# Patient Record
Sex: Male | Born: 1975 | Race: White | Hispanic: No | Marital: Married | Smoking: Current every day smoker
Health system: Southern US, Community
[De-identification: ages and names within clinical notes are randomized; demographics above are authoritative.]

## PROBLEM LIST (undated history)

## (undated) DIAGNOSIS — K219 Gastro-esophageal reflux disease without esophagitis: Secondary | ICD-10-CM

## (undated) HISTORY — PX: ACHILLES TENDON REPAIR: SUR1153

---

## 2006-05-17 ENCOUNTER — Emergency Department: Payer: Self-pay | Admitting: Emergency Medicine

## 2011-11-05 ENCOUNTER — Emergency Department: Payer: Self-pay | Admitting: Unknown Physician Specialty

## 2011-11-05 LAB — COMPREHENSIVE METABOLIC PANEL
Alkaline Phosphatase: 87 U/L (ref 50–136)
BUN: 9 mg/dL (ref 7–18)
Bilirubin,Total: 1.3 mg/dL — ABNORMAL HIGH (ref 0.2–1.0)
Chloride: 109 mmol/L — ABNORMAL HIGH (ref 98–107)
Co2: 26 mmol/L (ref 21–32)
Creatinine: 1.17 mg/dL (ref 0.60–1.30)
EGFR (Non-African Amer.): >60
Osmolality: 278 (ref 275–301)
SGOT(AST): 30 U/L (ref 15–37)
SGPT (ALT): 51 U/L

## 2011-11-05 LAB — CK TOTAL AND CKMB (NOT AT ARMC): CK-MB: 0.8 ng/mL (ref 0.5–3.6)

## 2011-11-05 LAB — CBC
HCT: 49.3 % (ref 40.0–52.0)
HGB: 17.4 g/dL (ref 13.0–18.0)
MCH: 31.9 pg (ref 26.0–34.0)
MCHC: 35.3 g/dL (ref 32.0–36.0)
MCV: 91 fL (ref 80–100)
RBC: 5.45 10*6/uL (ref 4.40–5.90)

## 2011-11-05 LAB — TROPONIN I: Troponin-I: <0.02 ng/mL

## 2011-11-05 LAB — TSH: Thyroid Stimulating Horm: 1.01 u[IU]/mL

## 2012-07-29 ENCOUNTER — Emergency Department: Payer: Self-pay | Admitting: Emergency Medicine

## 2013-04-23 ENCOUNTER — Ambulatory Visit: Payer: Self-pay | Admitting: Family Medicine

## 2013-08-04 ENCOUNTER — Ambulatory Visit: Payer: Self-pay

## 2013-08-05 ENCOUNTER — Ambulatory Visit (INDEPENDENT_AMBULATORY_CARE_PROVIDER_SITE_OTHER): Payer: BC Managed Care – PPO | Admitting: Podiatry

## 2013-08-05 ENCOUNTER — Ambulatory Visit (INDEPENDENT_AMBULATORY_CARE_PROVIDER_SITE_OTHER): Payer: BC Managed Care – PPO

## 2013-08-05 ENCOUNTER — Encounter: Payer: Self-pay | Admitting: Podiatry

## 2013-08-05 VITALS — Resp 16 | Ht 69.0 in | Wt 254.0 lb

## 2013-08-05 DIAGNOSIS — M8448XA Pathological fracture, other site, initial encounter for fracture: Secondary | ICD-10-CM

## 2013-08-05 DIAGNOSIS — M7660 Achilles tendinitis, unspecified leg: Secondary | ICD-10-CM

## 2013-08-05 DIAGNOSIS — M79609 Pain in unspecified limb: Secondary | ICD-10-CM

## 2013-08-05 DIAGNOSIS — M79673 Pain in unspecified foot: Secondary | ICD-10-CM

## 2013-08-05 MED ORDER — TRIAMCINOLONE ACETONIDE 10 MG/ML IJ SUSP: 10.0000 mg | Freq: Once | INTRAMUSCULAR | Status: AC

## 2013-08-05 NOTE — Progress Notes (Signed)
Subjective:     Patient ID: Jorge Bryan, male   DOB: Nov 11, 1975, 38 y.o.   MRN: [id number]  Foot Pain   patient points to the back of the left heel states it's been hurting me really badly for the last 5 days and I did get a new shoe the day that this started. I went to [facility] and told to stay off my foot   Review of Systems  All other systems reviewed and are negative.      Objective:   Physical Exam  Nursing note and vitals reviewed. Constitutional: He is oriented to person, place, and time.  Cardiovascular: Intact distal pulses.   Musculoskeletal: Normal range of motion.  Neurological: He is oriented to person, place, and time.  Skin: Skin is warm.   neurovascular status intact with range of motion of the subtalar and midtarsal joint adequate and mild equinus condition noted of both feet. I noted on the posterior left lateral heel there is intense discomfort at the Achilles tendon insertion with inflammation but minimal fluid buildup noted. Patient has good Fill time and does have high arch foot type    Assessment:     Achilles tendinitis left with inflammation lateral side versus stress fracture which is much less likely    Plan:     H&P and x-rays reviewed. Careful injection lateral Achilles 3 mg dexamethasone Kenalog 5 mg Xylocaine after discussing with patient chances for rupture. Applied air fracture walker to immobilize and gait instructions on physical therapy to start in 1 week. Reappoint 2 weeks

## 2013-08-05 NOTE — Progress Notes (Signed)
   Subjective:    Patient ID: Jorge Bryan, male    DOB: 30-Mar-1976, 38 y.o.   MRN: [id number]  HPI Comments: N pain L left heel and achilles D 5 days O slowly C worse A walking,  T went to urgent care yesterday, had x -ray, mobic, tylenol, staying off of foot,    Foot Pain Associated symptoms include a rash.      Review of Systems  Musculoskeletal:       Difficulty walking  Skin: Positive for rash.  All other systems reviewed and are negative.      Objective:   Physical Exam        Assessment & Plan:

## 2013-08-05 NOTE — Patient Instructions (Signed)

## 2013-08-19 ENCOUNTER — Ambulatory Visit: Payer: BC Managed Care – PPO | Admitting: Podiatry

## 2013-08-29 ENCOUNTER — Ambulatory Visit: Payer: BC Managed Care – PPO | Admitting: Podiatry

## 2014-07-17 IMAGING — CR DG THORACIC SPINE 2-3V
1 series · 4 of 4 positions shown · non-contrast
Comparison: None.

CLINICAL DATA: Pain post trauma

EXAM:
THORACIC SPINE - 3 VIEW

[Series 1: lat · 0.17mm/px · 4 of 4 slices shown]
[im 1/4]
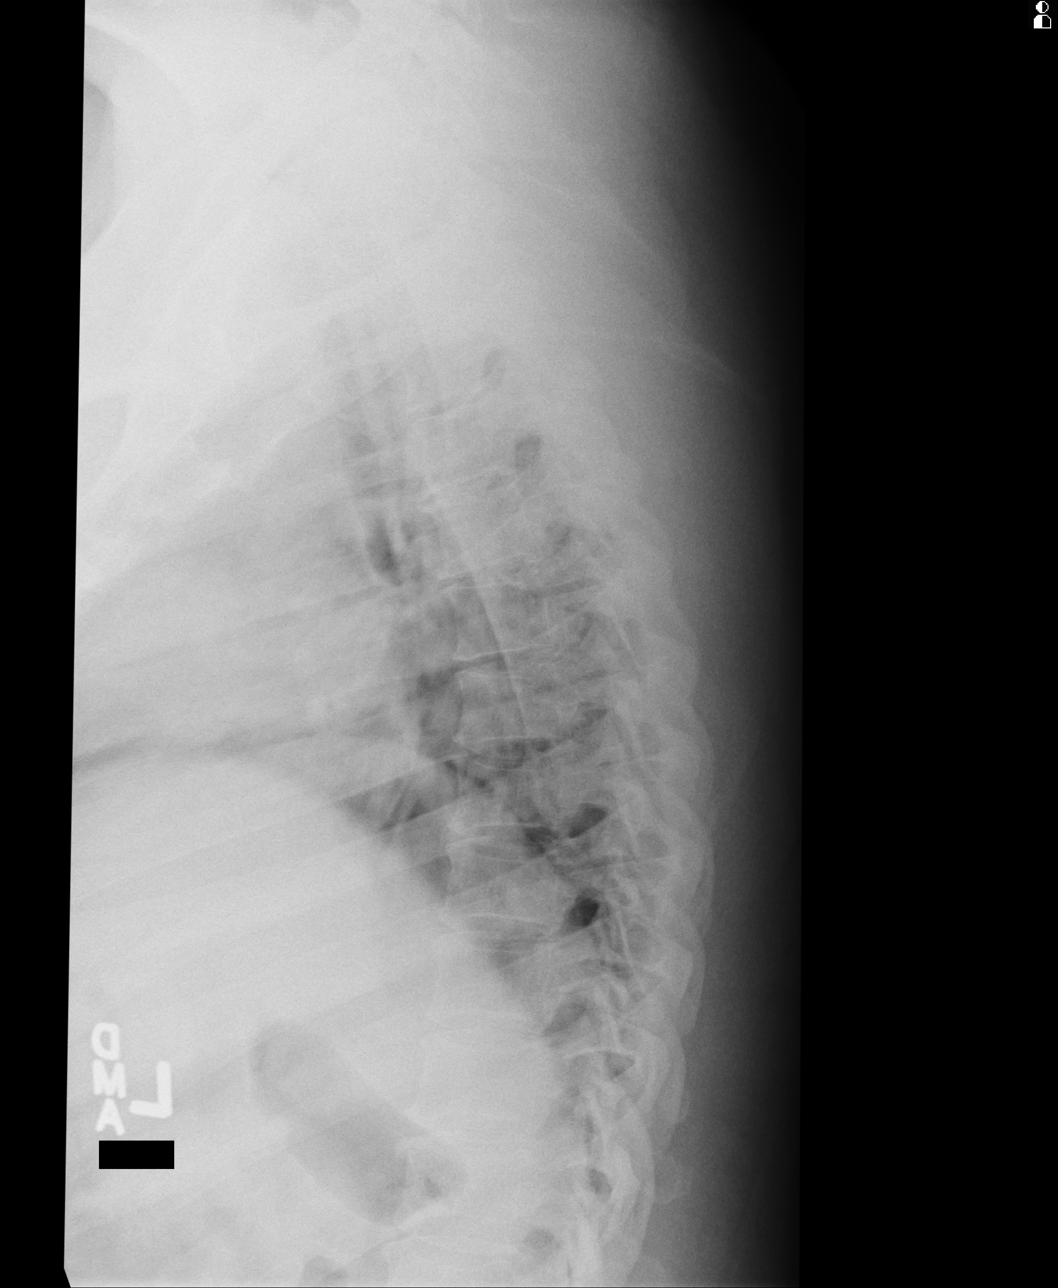
[im 2/4]
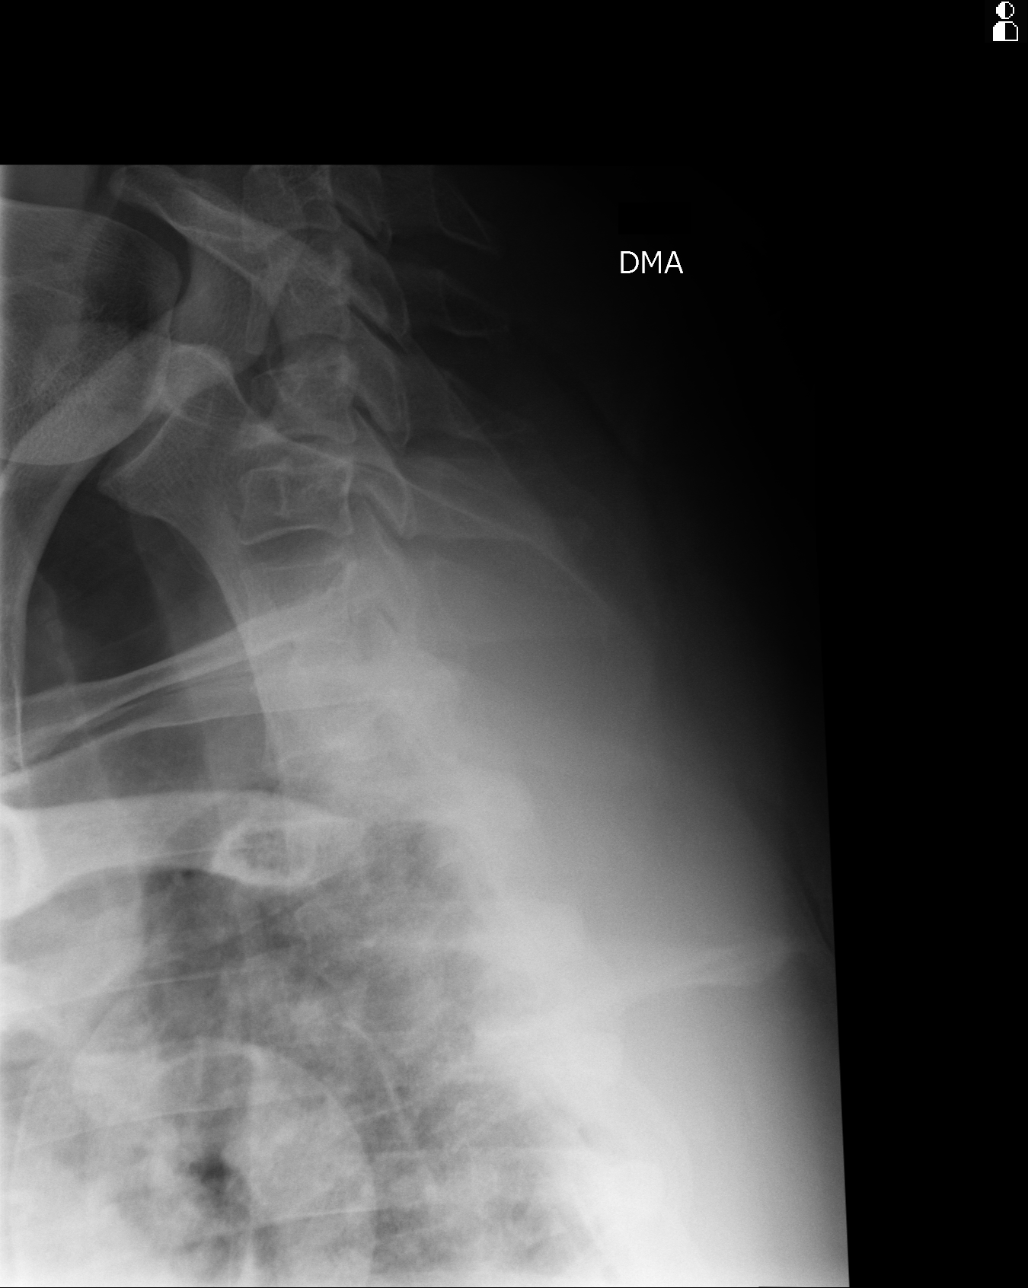
[im 3/4]
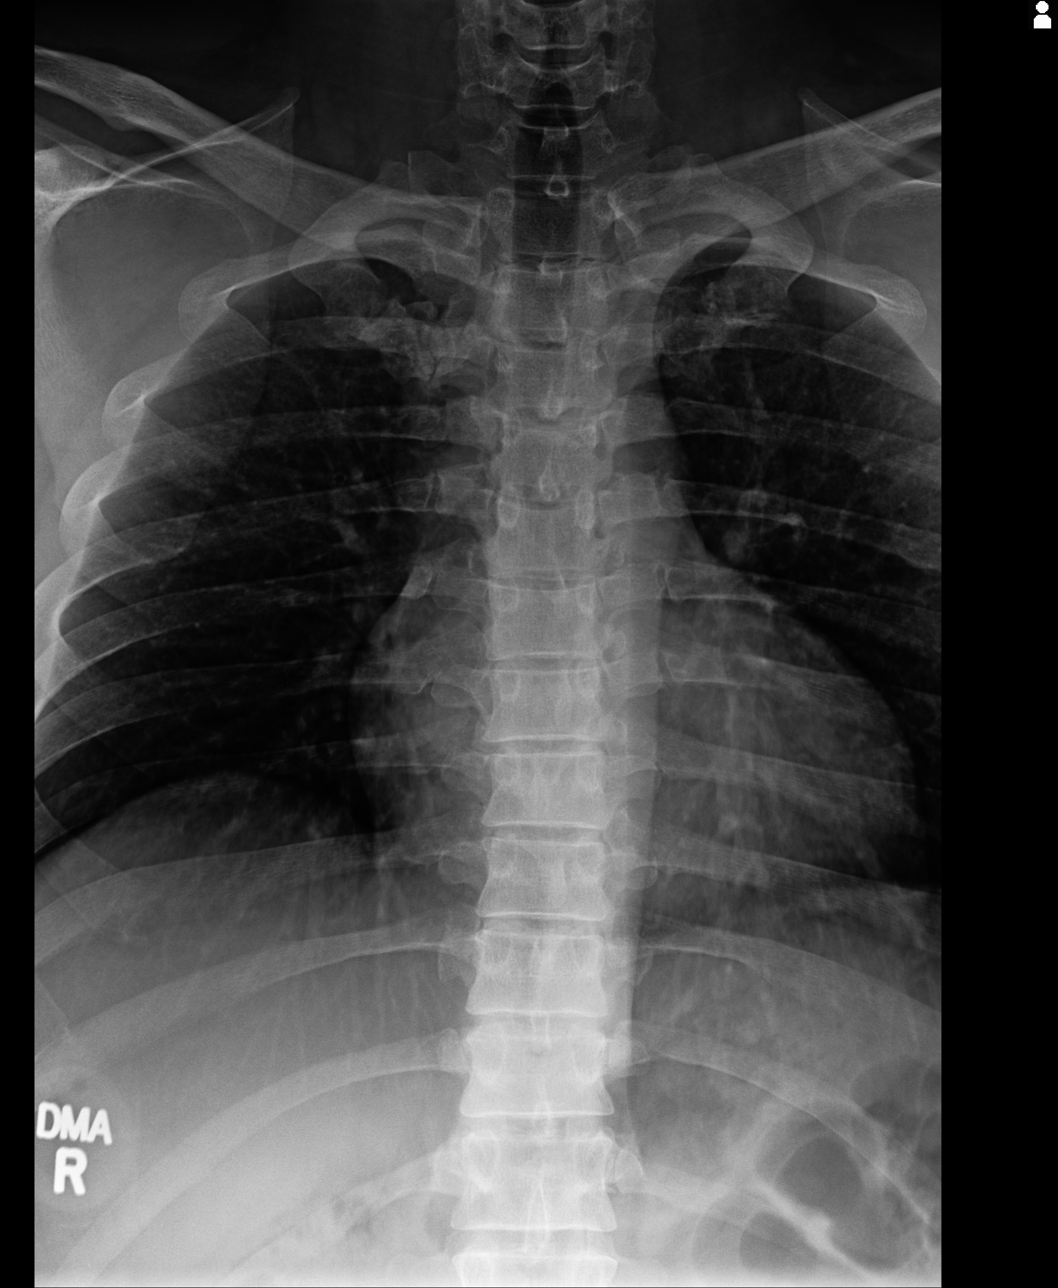
[im 4/4]
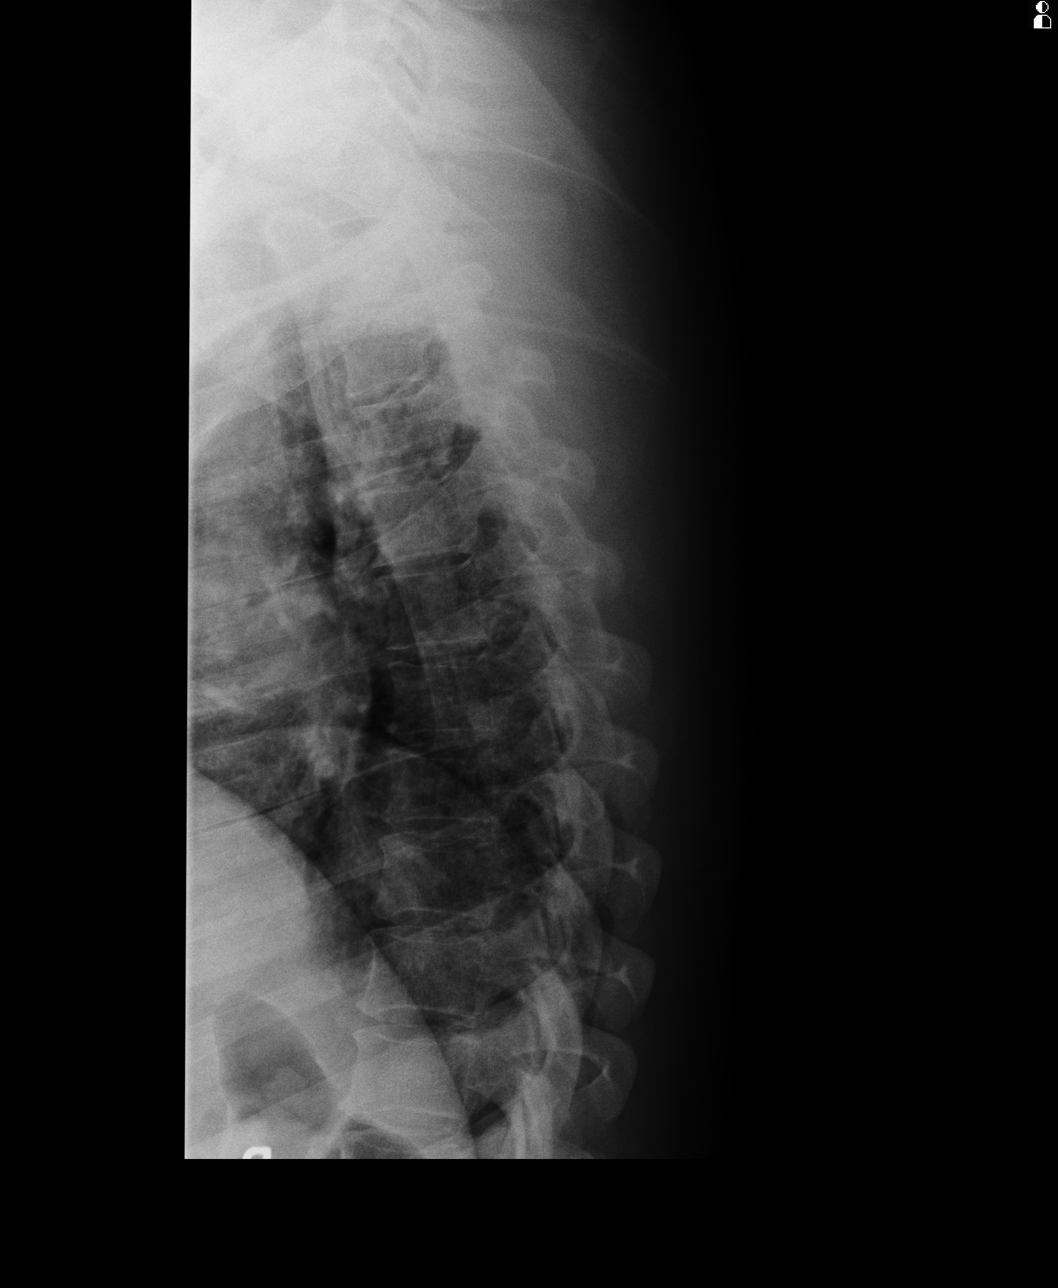

[4 of 4 positions shown; findings below may reference images not displayed]

FINDINGS: Frontal, lateral, and swimmer's views were obtained. There is no
fracture or spondylolisthesis. Disc spaces appear intact. No erosive
change.
IMPRESSION: No fracture or spondylolisthesis.

## 2014-11-04 ENCOUNTER — Encounter: Payer: Self-pay | Admitting: Emergency Medicine

## 2014-11-04 ENCOUNTER — Emergency Department
Admission: EM | Admit: 2014-11-04 | Discharge: 2014-11-04 | Disposition: A | Discharge status: Home / Self Care | Payer: Self-pay | Attending: Student | Admitting: Student

## 2014-11-04 DIAGNOSIS — W228XXA Striking against or struck by other objects, initial encounter: Secondary | ICD-10-CM | POA: Insufficient documentation

## 2014-11-04 DIAGNOSIS — Y92410 Unspecified street and highway as the place of occurrence of the external cause: Secondary | ICD-10-CM | POA: Insufficient documentation

## 2014-11-04 DIAGNOSIS — S0191XA Laceration without foreign body of unspecified part of head, initial encounter: Secondary | ICD-10-CM

## 2014-11-04 DIAGNOSIS — S0101XA Laceration without foreign body of scalp, initial encounter: Secondary | ICD-10-CM | POA: Insufficient documentation

## 2014-11-04 DIAGNOSIS — Y998 Other external cause status: Secondary | ICD-10-CM | POA: Insufficient documentation

## 2014-11-04 DIAGNOSIS — Z791 Long term (current) use of non-steroidal anti-inflammatories (NSAID): Secondary | ICD-10-CM | POA: Insufficient documentation

## 2014-11-04 DIAGNOSIS — Y9389 Activity, other specified: Secondary | ICD-10-CM | POA: Insufficient documentation

## 2014-11-04 MED ORDER — LIDOCAINE-EPINEPHRINE-TETRACAINE (LET) SOLUTION
NASAL | Status: AC
  Administered 2014-11-04: 3 mL via TOPICAL
  Filled 2014-11-04: qty 3

## 2014-11-04 MED ORDER — TETANUS-DIPHTH-ACELL PERTUSSIS 5-2.5-18.5 LF-MCG/0.5 IM SUSP
0.5000 mL | Freq: Once | INTRAMUSCULAR | Status: AC
  Administered 2014-11-04: 0.5 mL via INTRAMUSCULAR
  Filled 2014-11-04: qty 0.5

## 2014-11-04 MED ORDER — LIDOCAINE HCL (PF) 1 % IJ SOLN
INTRAMUSCULAR | Status: AC
  Administered 2014-11-04: 5 mL
  Filled 2014-11-04: qty 5

## 2014-11-04 MED ORDER — HYDROCODONE-ACETAMINOPHEN 5-325 MG PO TABS: 1.0000 | ORAL_TABLET | ORAL | Status: AC | PRN

## 2014-11-04 MED ORDER — IBUPROFEN 800 MG PO TABS: 800.0000 mg | ORAL_TABLET | Freq: Three times a day (TID) | ORAL | Status: AC | PRN

## 2014-11-04 NOTE — Discharge Instructions (Signed)
Head Injury You have a head injury. Headaches and throwing up (vomiting) are common after a head injury. It should be easy to wake up from sleeping. Sometimes you must stay in the hospital. Most problems happen within the first 24 hours. Side effects may occur up to 7-10 days after the injury.  WHAT ARE THE TYPES OF HEAD INJURIES? Head injuries can be as minor as a bump. Some head injuries can be more severe. More severe head injuries include:  A jarring injury to the brain (concussion).  A bruise of the brain (contusion). This mean there is bleeding in the brain that can cause swelling.  A cracked skull (skull fracture).  Bleeding in the brain that collects, clots, and forms a bump (hematoma). WHEN SHOULD I GET HELP RIGHT AWAY?   You are confused or sleepy.  You cannot be woken up.  You feel sick to your stomach (nauseous) or keep throwing up (vomiting).  Your dizziness or unsteadiness is getting worse.  You have very bad, lasting headaches that are not helped by medicine. Take medicines only as told by your doctor.  You cannot use your arms or legs like normal.  You cannot walk.  You notice changes in the black spots in the center of the colored part of your eye (pupil).  You have clear or bloody fluid coming from your nose or ears.  You have trouble seeing. During the next 24 hours after the injury, you must stay with someone who can watch you. This person should get help right away (call 911 in the [place] if you start to shake and are not able to control it (have seizures), you pass out, or you are unable to wake up. HOW CAN I PREVENT A HEAD INJURY IN THE FUTURE?  Wear seat belts.  Wear a helmet while bike riding and playing sports like football.  Stay away from dangerous activities around the house. WHEN CAN I RETURN TO NORMAL ACTIVITIES AND ATHLETICS? See your doctor before doing these activities. You should not do normal activities or play contact sports until 1 week  after the following symptoms have stopped:  Headache that does not go away.  Dizziness.  Poor attention.  Confusion.  Memory problems.  Sickness to your stomach or throwing up.  Tiredness.  Fussiness.  Bothered by bright lights or loud noises.  Anxiousness or depression.  Restless sleep. MAKE SURE YOU:   Understand these instructions.  Will watch your condition.  Will get help right away if you are not doing well or get worse. Document Released: 03/09/2008 Document Revised: 08/11/2013 Document Reviewed: 12/02/2012 [facility] Patient Information 2015 [place], [place]. This information is not intended to replace advice given to you by your health care provider. Make sure you discuss any questions you have with your health care provider.  Stitches, Staples, or Skin Adhesive Strips  Stitches (sutures), staples, and skin adhesive strips hold the skin together as it heals. They will usually be in place for 7 days or less. HOME CARE  Wash your hands with soap and water before and after you touch your wound.  Only take medicine as told by your doctor.  Cover your wound only if your doctor told you to. Otherwise, leave it open to air.  Do not get your stitches wet or dirty. If they get dirty, dab them gently with a clean washcloth. Wet the washcloth with soapy water. Do not rub. Pat them dry gently.  Do not put medicine or medicated cream on your  stitches unless your doctor told you to.  Do not take out your own stitches or staples. Skin adhesive strips will fall off by themselves.  Do not pick at the wound. Picking can cause an infection.  Do not miss your follow-up appointment.  If you have problems or questions, call your doctor. GET HELP RIGHT AWAY IF:   You have a temperature by mouth above 102 F (38.9 C), not controlled by medicine.  You have chills.  You have redness or pain around your stitches.  There is puffiness (swelling) around your stitches.  You  notice fluid (drainage) from your stitches.  There is a bad smell coming from your wound. MAKE SURE YOU:  Understand these instructions.  Will watch your condition.  Will get help if you are not doing well or get worse. Document Released: 01/22/2009 Document Revised: 06/19/2011 Document Reviewed: 01/22/2009 [facility] Patient Information 2015 [place], [place]. This information is not intended to replace advice given to you by your health care provider. Make sure you discuss any questions you have with your health care provider.  Puncture Wound A puncture wound is an injury that extends through all layers of the skin and into the tissue beneath the skin (subcutaneous tissue). Puncture wounds become infected easily because germs often enter the body and go beneath the skin during the injury. Having a deep wound with a small entrance point makes it difficult for your caregiver to adequately clean the wound. This is especially true if you have stepped on a nail and it has passed through a dirty shoe or other situations where the wound is obviously contaminated. CAUSES  Many puncture wounds involve glass, nails, splinters, fish hooks, or other objects that enter the skin (foreign bodies). A puncture wound may also be caused by a human bite or animal bite. DIAGNOSIS  A puncture wound is usually diagnosed by your history and a physical exam. You may need to have an X-ray or an ultrasound to check for any foreign bodies still in the wound. TREATMENT   Your caregiver will clean the wound as thoroughly as possible. Depending on the location of the wound, a bandage (dressing) may be applied.  Your caregiver might prescribe antibiotic medicines.  You may need a follow-up visit to check on your wound. Follow all instructions as directed by your caregiver. HOME CARE INSTRUCTIONS   Change your dressing once per day, or as directed by your caregiver. If the dressing sticks, it may be removed by soaking the  area in water.  If your caregiver has given you follow-up instructions, it is very important that you return for a follow-up appointment. Not following up as directed could result in a chronic or permanent injury, pain, and disability.  Only take over-the-counter or prescription medicines for pain, discomfort, or fever as directed by your caregiver.  If you are given antibiotics, take them as directed. Finish them even if you start to feel better. You may need a tetanus shot if:  You cannot remember when you had your last tetanus shot.  You have never had a tetanus shot. If you got a tetanus shot, your arm may swell, get red, and feel warm to the touch. This is common and not a problem. If you need a tetanus shot and you choose not to have one, there is a rare chance of getting tetanus. Sickness from tetanus can be serious. You may need a rabies shot if an animal bite caused your puncture wound. SEEK MEDICAL CARE IF:  You have redness, swelling, or increasing pain in the wound.  You have red streaks going away from the wound.  You notice a bad smell coming from the wound or dressing.  You have yellowish-white fluid (pus) coming from the wound.  You are treated with an antibiotic for infection, but the infection is not getting better.  You notice something in the wound, such as rubber from your shoe, cloth, or another object.  You have a fever.  You have severe pain.  You have difficulty breathing.  You feel dizzy or faint.  You cannot stop vomiting.  You lose feeling, develop numbness, or cannot move a limb below the wound.  Your symptoms worsen. MAKE SURE YOU:  Understand these instructions.  Will watch your condition.  Will get help right away if you are not doing well or get worse. Document Released: 01/04/2005 Document Revised: 06/19/2011 Document Reviewed: 09/13/2010 [facility] Patient Information 2015 [place], [place]. This information is not intended to replace  advice given to you by your health care provider. Make sure you discuss any questions you have with your health care provider.

## 2014-11-04 NOTE — ED Notes (Signed)
Pt states he stepped onto curb and fell backwards hitting head on a truck mirror, obtained laceration to top of head, denies any LOC, pt states there was a nurse near by that placed steri strips and bandage, bleeding controlled at present

## 2014-11-04 NOTE — ED Notes (Signed)
Hit head on truck mirror today around 1300. Denies LOC. Was able to finish work and drive home here. Unsure of last tetanus shot.

## 2014-11-04 NOTE — ED Provider Notes (Signed)
[facility] Emergency Department Provider Note  ____________________________________________  Time seen: Approximately 7:03 PM  I have reviewed the triage vital signs and the nursing notes.   HISTORY  Chief Complaint Head Laceration    HPI Jorge Bryan is a 39 y.o. male who presents for evaluation of laceration to the top of his head. Patient states he hit his head on the truck mirror approximately 6 hours ago. Denies any loss of consciousness. Last tetanus unknown.  History reviewed. No pertinent past medical history.  There are no active problems to display for this patient.   History reviewed. No pertinent past surgical history.  Current Outpatient Rx  Name  Route  Sig  Dispense  Refill  . HYDROcodone-acetaminophen (NORCO) 5-325 MG per tablet   Oral   Take 1-2 tablets by mouth every 4 (four) hours as needed for moderate pain.   10 tablet   0   . ibuprofen (ADVIL,MOTRIN) 800 MG tablet   Oral   Take 1 tablet (800 mg total) by mouth every 8 (eight) hours as needed.   30 tablet   0   . Meloxicam (MOBIC PO)   Oral   Take by mouth daily.           Allergies Review of patient's allergies indicates no known allergies.  History reviewed. No pertinent family history.  Social History History  Substance Use Topics  . Smoking status: Current Every Day Smoker    Types: E-cigarettes  . Smokeless tobacco: Not on file  . Alcohol Use: No    Review of Systems Constitutional: No fever/chills Eyes: No visual changes. ENT: No sore throat. Cardiovascular: Denies chest pain. Respiratory: Denies shortness of breath. Gastrointestinal: No abdominal pain.  No nausea, no vomiting.  No diarrhea.  No constipation. Genitourinary: Negative for dysuria. Musculoskeletal: Negative for back pain. Skin: Laceration to scalp. Neurological: Negative for headaches, focal weakness or numbness.  10-point ROS otherwise  negative.  ____________________________________________   PHYSICAL EXAM:  VITAL SIGNS: ED Triage Vitals  Enc Vitals Group     BP 11/04/14 1748 128/76 mmHg     Pulse Rate 11/04/14 1748 81     Resp 11/04/14 1748 18     Temp 11/04/14 1748 99 F (37.2 C)     Temp Source 11/04/14 1748 Oral     SpO2 11/04/14 1748 97 %     Weight 11/04/14 1748 234 lb (106.142 kg)     Height 11/04/14 1748 5\' 10"  (1.778 m)     Head Cir --      Peak Flow --      Pain Score 11/04/14 1748 3     Pain Loc --      Pain Edu? --      Excl. in GC? --     Constitutional: Alert and oriented. Well appearing and in no acute distress. Eyes: Conjunctivae are normal. PERRL. EOMI. Head: 5 cm linear laceration to the top of the scalp, actively bleeding.. 1/2 cm semicircular laceration Nose: No congestion/rhinnorhea. Mouth/Throat: Mucous membranes are moist.  Oropharynx non-erythematous. Neck: No stridor.   Cardiovascular: Normal rate, regular rhythm. Grossly normal heart sounds.  Good peripheral circulation. Respiratory: Normal respiratory effort.  No retractions. Lungs CTAB. Musculoskeletal: No lower extremity tenderness nor edema.  No joint effusions. Neurologic:  Normal speech and language. No gross focal neurologic deficits are appreciated. No gait instability. Skin:  Skin is warm, dry (see head above). Psychiatric: Mood and affect are normal. Speech and behavior are normal.  ____________________________________________   LABS (all labs ordered are listed, but only abnormal results are displayed)  Labs Reviewed - No data to display ____________________________________________    PROCEDURES  Procedure(s) performed: yes  LACERATION REPAIR Performed by: Evangeline Dakin Authorized by: Evangeline Dakin Consent: Verbal consent obtained. Risks and benefits: risks, benefits and alternatives were discussed Consent given by: patient Patient identity confirmed: provided demographic data Prepped and Draped  in normal sterile fashion Wound explored  Laceration Location: Scalp  Laceration Length: 5cm  No Foreign Bodies seen or palpated  Anesthesia: local infiltration  Local anesthetic: lidocaine1% without epinephrine  Anesthetic total: 4.5 ml  Irrigation method: syringe Amount of cleaning: standard  Skin closure: Staples and 4-0 vicryl  Number of sutures: 3 Number of staples: 6  Technique: simple interrupted  Patient tolerance: Patient tolerated the procedure well with no immediate complications.  Critical Care performed: No  ____________________________________________   INITIAL IMPRESSION / ASSESSMENT AND PLAN / ED COURSE  Pertinent labs & imaging results that were available during my care of the patient were reviewed by me and considered in my medical decision making (see chart for details).  Scalp laceration Closed with staples and stitches. Bleeding controlled. Patient given Rx for Motrin 800 and advised to return in one week for follow-up. Patient voices no other emergency medical complaints at this time and will return to the ER with any worsening symptomology. ____________________________________________   FINAL CLINICAL IMPRESSION(S) / ED DIAGNOSES  Final diagnoses:  Laceration of head, initial encounter      Evangeline Dakin, PA-C 11/04/14 2148  Gayla Doss, MD 11/04/14 2242

## 2016-03-04 ENCOUNTER — Emergency Department
Admission: EM | Admit: 2016-03-04 | Discharge: 2016-03-04 | Disposition: A | Discharge status: Home / Self Care | Payer: Self-pay | Attending: Emergency Medicine | Admitting: Emergency Medicine

## 2016-03-04 ENCOUNTER — Emergency Department: Payer: Self-pay

## 2016-03-04 DIAGNOSIS — K219 Gastro-esophageal reflux disease without esophagitis: Secondary | ICD-10-CM | POA: Insufficient documentation

## 2016-03-04 DIAGNOSIS — R0789 Other chest pain: Secondary | ICD-10-CM

## 2016-03-04 DIAGNOSIS — Z79899 Other long term (current) drug therapy: Secondary | ICD-10-CM | POA: Insufficient documentation

## 2016-03-04 DIAGNOSIS — F17210 Nicotine dependence, cigarettes, uncomplicated: Secondary | ICD-10-CM | POA: Insufficient documentation

## 2016-03-04 DIAGNOSIS — M549 Dorsalgia, unspecified: Secondary | ICD-10-CM | POA: Insufficient documentation

## 2016-03-04 LAB — BASIC METABOLIC PANEL
Anion gap: 5 (ref 5–15)
BUN: 14 mg/dL (ref 6–20)
CHLORIDE: 104 mmol/L (ref 101–111)
CO2: 30 mmol/L (ref 22–32)
Calcium: 9 mg/dL (ref 8.9–10.3)
Creatinine, Ser: 1.16 mg/dL (ref 0.61–1.24)
GFR calc Af Amer: >60 mL/min (ref >60)
GFR calc non Af Amer: >60 mL/min (ref >60)
Glucose, Bld: 99 mg/dL (ref 65–99)
Potassium: 4.2 mmol/L (ref 3.5–5.1)
SODIUM: 139 mmol/L (ref 135–145)

## 2016-03-04 LAB — CBC
HCT: 47.4 % (ref 40.0–52.0)
Hemoglobin: 17 g/dL (ref 13.0–18.0)
MCH: 31.7 pg (ref 26.0–34.0)
MCHC: 35.8 g/dL (ref 32.0–36.0)
MCV: 88.4 fL (ref 80.0–100.0)
Platelets: 224 10*3/uL (ref 150–440)
RBC: 5.36 MIL/uL (ref 4.40–5.90)
RDW: 13.6 % (ref 11.5–14.5)
WBC: 10 10*3/uL (ref 3.8–10.6)

## 2016-03-04 LAB — TROPONIN I: Troponin I: <0.03 ng/mL (ref <0.03)

## 2016-03-04 MED ORDER — ASPIRIN 81 MG PO TBEC: 81.0000 mg | DELAYED_RELEASE_TABLET | Freq: Every day | ORAL | 12 refills | Status: AC

## 2016-03-04 MED ORDER — PANTOPRAZOLE SODIUM 40 MG PO TBEC: 40.0000 mg | DELAYED_RELEASE_TABLET | Freq: Every day | ORAL | 1 refills | Status: AC

## 2016-03-04 NOTE — ED Triage Notes (Signed)
Pt reports central chest pain that radiates to jaw for two days. Pt reports SOB, dizziness and back pain. Pt states his girlfriend took his blood pressure and it was elevated. Pt in no apparent distress in triage.

## 2016-03-04 NOTE — ED Notes (Signed)
Pt. Going home with family. 

## 2016-03-04 NOTE — ED Notes (Signed)
Pt. States upper centralized chest pain that started wednesday.  Pt. States it does not radiate.

## 2016-03-04 NOTE — ED Provider Notes (Signed)
[facility] Emergency Department Provider Note        Time seen: ----------------------------------------- 8:58 PM on 03/04/2016 -----------------------------------------    I have reviewed the triage vital signs and the nursing notes.   HISTORY  Chief Complaint Chest Pain and Shortness of Breath    HPI Jorge Bryan is a 40 y.o. male who presents to ER for central chest pain that radiates into his jaw for the last several days. Patient has describes some reflux and indigestion. Has reports some dizziness as well. His girlfriend took his blood pressure was elevated when he went to [facility]. They present to the ER for further evaluation. Patient denies any fevers, chills or other complaints.   No past medical history on file.  There are no active problems to display for this patient.   No past surgical history on file.  Allergies Patient has no known allergies.  Social History Social History  Substance Use Topics  . Smoking status: Current Every Day Smoker    Types: E-cigarettes  . Smokeless tobacco: Not on file  . Alcohol use No    Review of Systems Constitutional: Negative for fever. Cardiovascular: Positive for chest pain Respiratory: Negative for shortness of breath. Gastrointestinal: Negative for abdominal pain, vomiting and diarrhea. Genitourinary: Negative for dysuria. Musculoskeletal: Positive for back pain Skin: Negative for rash. Neurological: Negative for headaches, focal weakness or numbness.  10-point ROS otherwise negative.  ____________________________________________   PHYSICAL EXAM:  VITAL SIGNS: ED Triage Vitals  Enc Vitals Group     BP 03/04/16 1828 (!) 163/102     Pulse Rate 03/04/16 1828 73     Resp 03/04/16 1828 18     Temp 03/04/16 1828 98.5 F (36.9 C)     Temp Source 03/04/16 1828 Oral     SpO2 03/04/16 1828 99 %     Weight 03/04/16 1829 227 lb (103 kg)     Height 03/04/16 1829 5\' 9"  (1.753 m)   Head Circumference --      Peak Flow --      Pain Score 03/04/16 1831 3     Pain Loc --      Pain Edu? --      Excl. in GC? --     Constitutional: Alert and oriented. Well appearing and in no distress. Eyes: Conjunctivae are normal. PERRL. Normal extraocular movements. ENT   Head: Normocephalic and atraumatic.   Nose: No congestion/rhinnorhea.   Mouth/Throat: Mucous membranes are moist.   Neck: No stridor. Cardiovascular: Normal rate, regular rhythm. No murmurs, rubs, or gallops. Respiratory: Normal respiratory effort without tachypnea nor retractions. Breath sounds are clear and equal bilaterally. No wheezes/rales/rhonchi. Gastrointestinal: Soft and nontender. Normal bowel sounds Musculoskeletal: Nontender with normal range of motion in all extremities. No lower extremity tenderness nor edema. Neurologic:  Normal speech and language. No gross focal neurologic deficits are appreciated.  Skin:  Skin is warm, dry and intact. No rash noted. Psychiatric: Mood and affect are normal. Speech and behavior are normal.  ____________________________________________  EKG: Interpreted by me. Sinus rhythm with sinus arrhythmia, rate is 67 bpm, normal PR interval, normal QRS, normal QT, normal axis.  ____________________________________________  ED COURSE:  Pertinent labs & imaging results that were available during my care of the patient were reviewed by me and considered in my medical decision making (see chart for details). Clinical Course   Patient presents to the ER in no distress. We will assess with labs and imaging.  Procedures ____________________________________________  LABS (pertinent positives/negatives)  Labs Reviewed  BASIC METABOLIC PANEL  CBC  TROPONIN I    RADIOLOGY  Chest x-ray is unremarkable  ____________________________________________  FINAL ASSESSMENT AND PLAN  Chest pain  Plan: Patient with labs and imaging as dictated above. Symptoms  seem consistent with indigestion. No specific explanation for all of his symptoms. He is low risk according to his heart score. He will be referred to cardiology for outpatient follow-up. I will start her on a baby aspirin and place him on Protonix.   Emily FilbertWilliams, Jonathan E, MD   Note: This dictation was prepared with Dragon dictation. Any transcriptional errors that result from this process are unintentional    Emily FilbertJonathan E Williams, MD 03/04/16 2100

## 2017-05-28 IMAGING — CR DG CHEST 2V
2 series · 2 of 2 positions shown · non-contrast
Comparison: 11/05/2011

CLINICAL DATA: Chest pain, dizziness for 2 days

EXAM:
CHEST  2 VIEW

[chest pa]
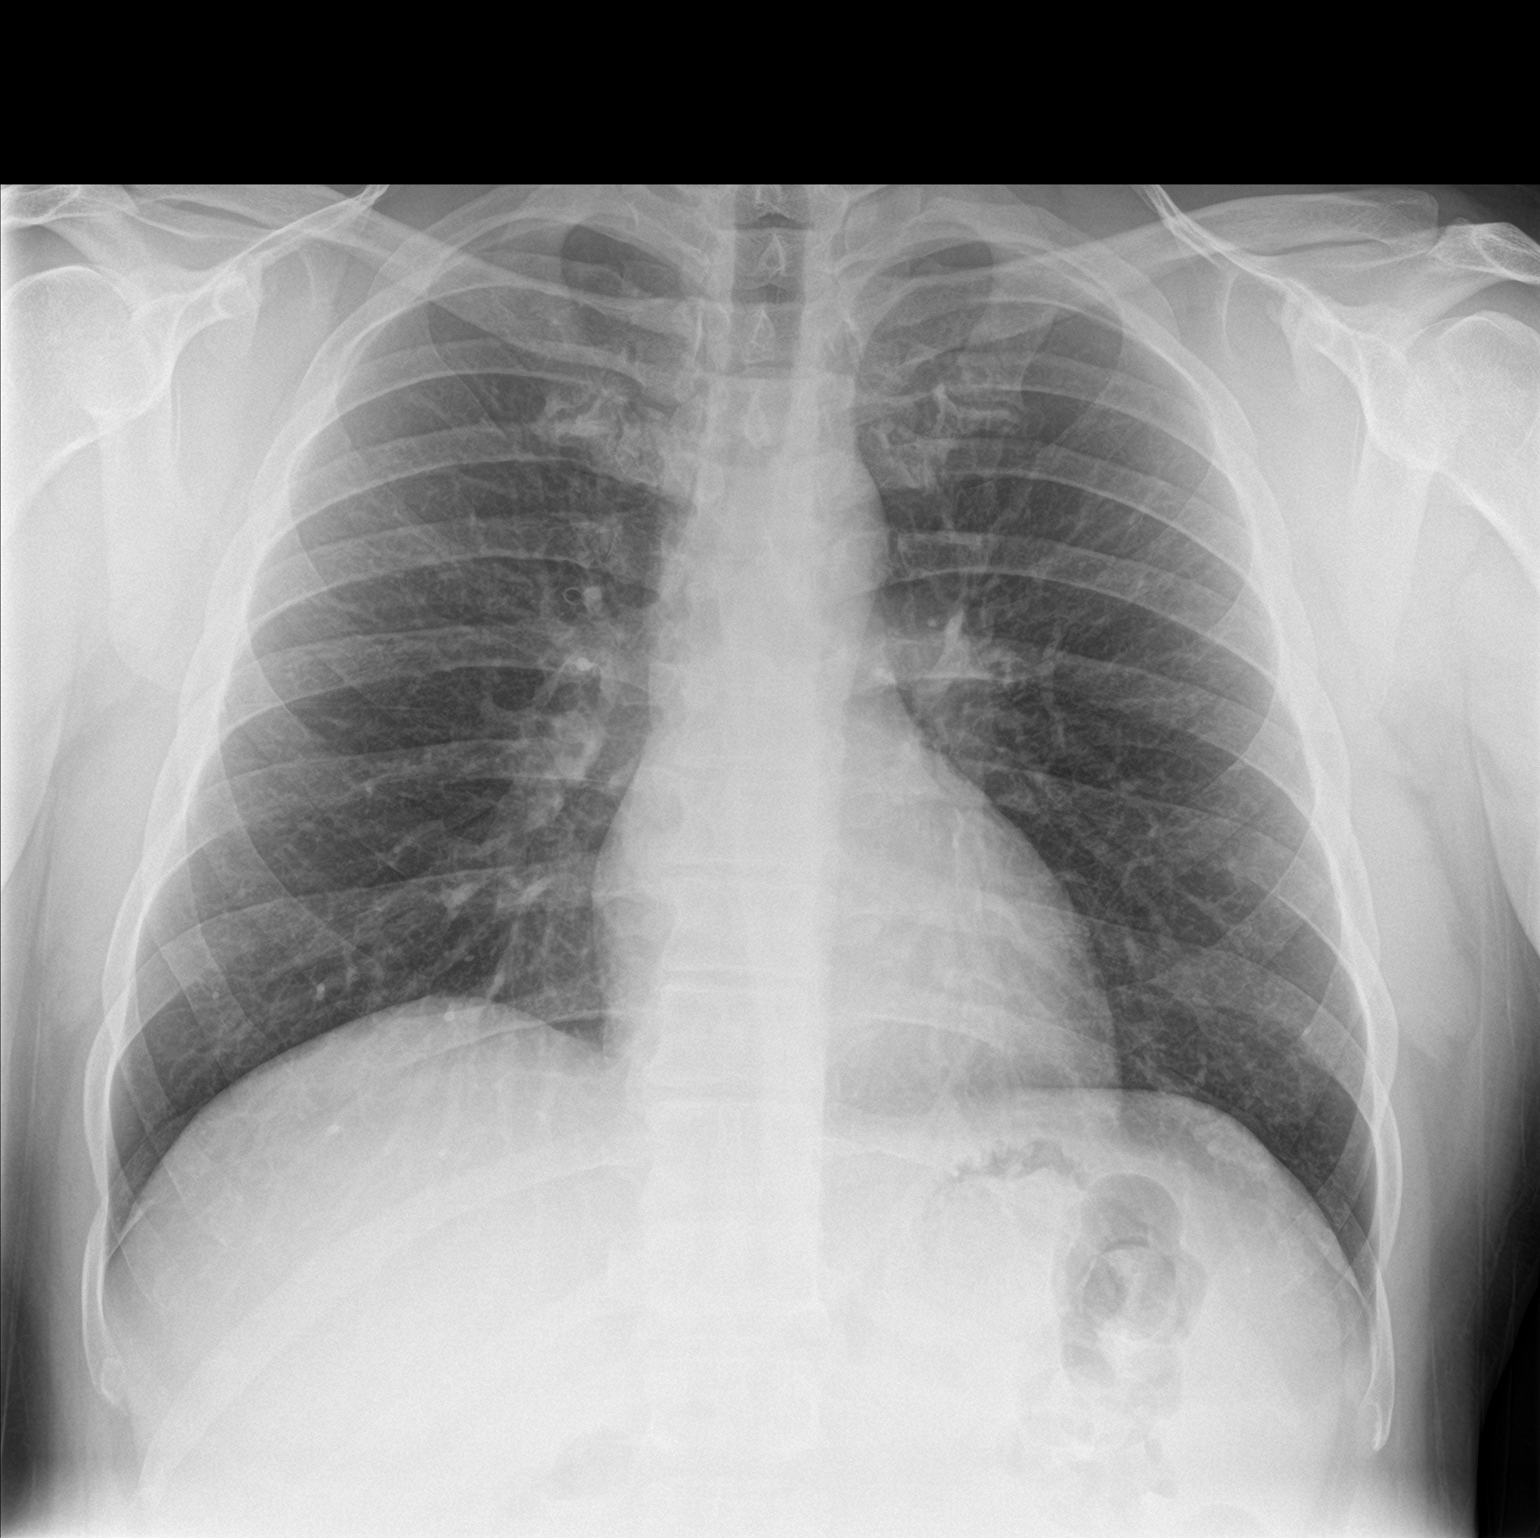

[chest lat]
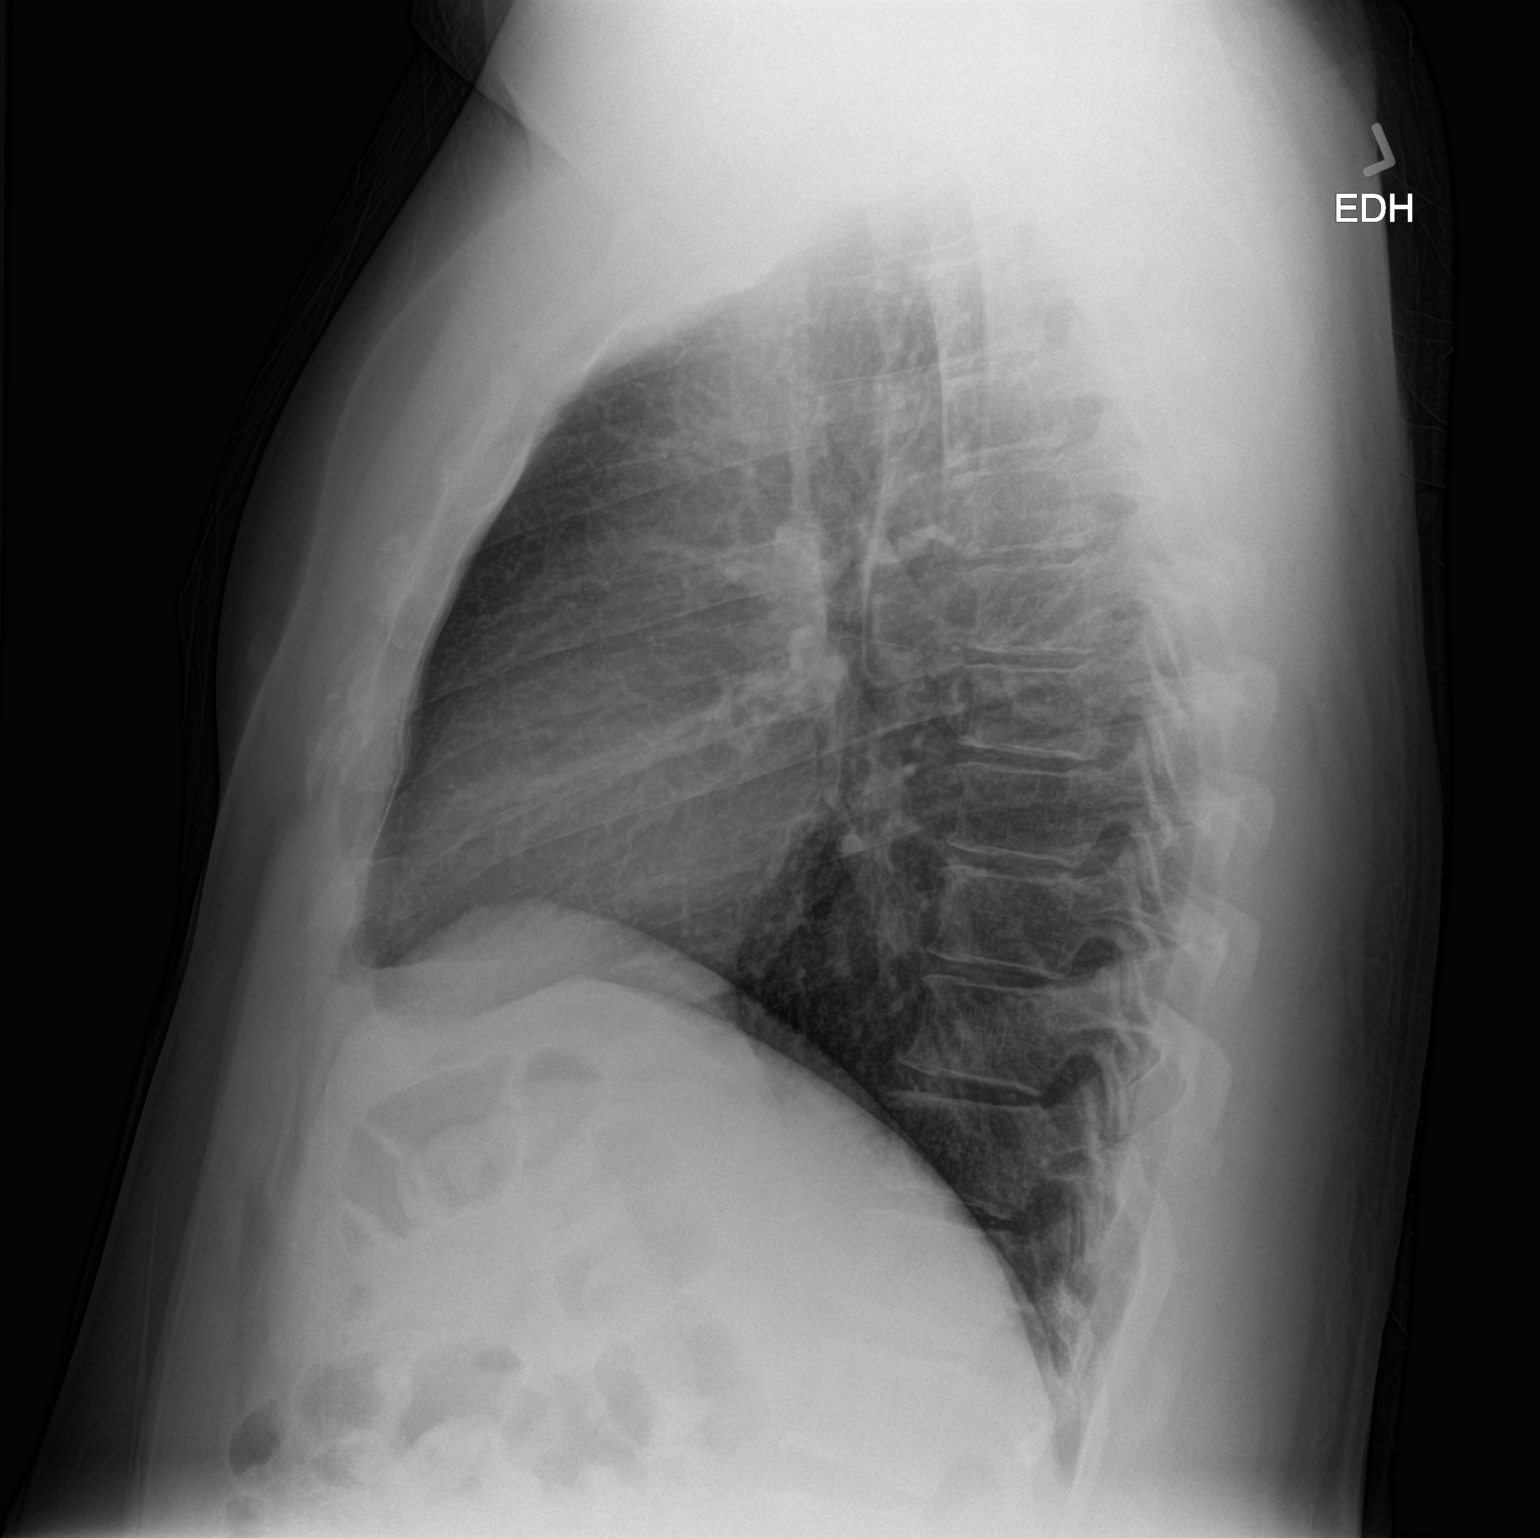

[2 of 2 positions shown; findings below may reference images not displayed]

FINDINGS: Cardiomediastinal silhouette is unremarkable. No acute infiltrate or
pleural effusion. No pulmonary edema. Bony thorax is unremarkable.
IMPRESSION: No active cardiopulmonary disease.

## 2019-07-15 ENCOUNTER — Other Ambulatory Visit: Payer: Self-pay

## 2019-07-15 ENCOUNTER — Ambulatory Visit
Admission: EM | Admit: 2019-07-15 | Discharge: 2019-07-15 | Disposition: A | Discharge status: Home / Self Care | Payer: BC Managed Care – PPO | Attending: Family Medicine | Admitting: Family Medicine

## 2019-07-15 DIAGNOSIS — B356 Tinea cruris: Secondary | ICD-10-CM | POA: Diagnosis not present

## 2019-07-15 HISTORY — DX: Gastro-esophageal reflux disease without esophagitis: K21.9

## 2019-07-15 MED ORDER — FLUCONAZOLE 150 MG PO TABS: ORAL_TABLET | ORAL | 0 refills | Status: AC

## 2019-07-15 NOTE — Discharge Instructions (Signed)
Over the counter antifungal cream or spray

## 2019-07-15 NOTE — ED Triage Notes (Signed)
Pt presents with c/o rash in his groin. Pt states the area does itch. He has not tried any OTC medications. Pt denies any urinary symptoms. Pt states area is red and itchy, denies burning or drainage.

## 2019-07-28 NOTE — ED Provider Notes (Signed)
[facility]    CSN: [id number] Arrival date & time: 07/15/19  1702      History   Chief Complaint Chief Complaint  Patient presents with  . Rash    HPI Jorge Bryan is a 44 y.o. male.   44 yo male with a c/o rash to the groin area. States rash is itchy. Denies any blisters, pain, drainage, dysuria, fevers, chills.     Rash   Past Medical History:  Diagnosis Date  . GERD (gastroesophageal reflux disease)     There are no problems to display for this patient.   Past Surgical History:  Procedure Laterality Date  . ACHILLES TENDON REPAIR         Home Medications    Prior to Admission medications   Medication Sig Start Date End Date Taking? Authorizing Provider  famotidine (PEPCID) 10 MG tablet Take 10 mg by mouth 2 (two) times daily.   Yes [provider]  Multiple Vitamin (MULTIVITAMIN) tablet Take 1 tablet by mouth daily.   Yes [provider]  aspirin 81 MG EC tablet Take 1 tablet (81 mg total) by mouth daily. Swallow whole. 03/04/16   Earleen Newport, MD  fluconazole (DIFLUCAN) 150 MG tablet Take one tablet po once, then repeat in 1 week 07/15/19   Norval Gable, MD  HYDROcodone-acetaminophen (NORCO) 5-325 MG per tablet Take 1-2 tablets by mouth every 4 (four) hours as needed for moderate pain. 11/04/14   Beers, Pierce Crane, PA-C  ibuprofen (ADVIL,MOTRIN) 800 MG tablet Take 1 tablet (800 mg total) by mouth every 8 (eight) hours as needed. 11/04/14   Beers, Pierce Crane, PA-C  Meloxicam (MOBIC PO) Take by mouth daily.    [provider]  pantoprazole (PROTONIX) 40 MG tablet Take 1 tablet (40 mg total) by mouth daily. 03/04/16 03/04/17  Earleen Newport, MD    Family History Family History  Problem Relation Age of Onset  . Healthy Mother   . Healthy Father     Social History Social History   Tobacco Use  . Smoking status: Current Every Day Smoker    Types: E-cigarettes  . Smokeless tobacco: Never Used    Substance Use Topics  . Alcohol use: No  . Drug use: No     Allergies   Patient has no known allergies.   Review of Systems Review of Systems  Skin: Positive for rash.     Physical Exam Triage Vital Signs ED Triage Vitals  Enc Vitals Group     BP 07/15/19 1713 132/68     Pulse Rate 07/15/19 1713 77     Resp 07/15/19 1713 18     Temp 07/15/19 1713 98.2 F (36.8 C)     Temp Source 07/15/19 1713 Oral     SpO2 07/15/19 1713 100 %     Weight 07/15/19 1710 228 lb (103.4 kg)     Height 07/15/19 1710 5\' 9"  (1.753 m)     Head Circumference --      Peak Flow --      Pain Score 07/15/19 1710 0     Pain Loc --      Pain Edu? --      Excl. in [place]? --    No data found.  Updated Vital Signs BP 132/68 (BP Location: Left Arm)   Pulse 77   Temp 98.2 F (36.8 C) (Oral)   Resp 18   Ht 5\' 9"  (1.753 m)   Wt 103.4 kg  SpO2 100%   BMI 33.67 kg/m   Visual Acuity Right Eye Distance:   Left Eye Distance:   Bilateral Distance:    Right Eye Near:   Left Eye Near:    Bilateral Near:     Physical Exam Vitals and nursing note reviewed.  Constitutional:      General: He is not in acute distress.    Appearance: He is not toxic-appearing or diaphoretic.  Skin:    Findings: Rash (erythematous, scaly rash to groin area bilaterally) present.  Neurological:     Mental Status: He is alert.      UC Treatments / Results  Labs (all labs ordered are listed, but only abnormal results are displayed) Labs Reviewed - No data to display  EKG   Radiology No results found.  Procedures Procedures (including critical care time)  Medications Ordered in UC Medications - No data to display  Initial Impression / Assessment and Plan / UC Course  I have reviewed the triage vital signs and the nursing notes.  Pertinent labs & imaging results that were available during my care of the patient were reviewed by me and considered in my medical decision making (see chart for details).       Final Clinical Impressions(s) / UC Diagnoses   Final diagnoses:  Tinea cruris     Discharge Instructions     Over the counter antifungal cream or spray    ED Prescriptions    Medication Sig Dispense Auth. Provider   fluconazole (DIFLUCAN) 150 MG tablet Take one tablet po once, then repeat in 1 week 2 tablet Bijou Easler, Pamala Hurry, MD      1. diagnosis reviewed with patient 2. rx as per orders above; reviewed possible side effects, interactions, risks and benefits  3. Recommend supportive treatment with otc antifungal cream 4. Follow-up prn if symptoms worsen or don't improve   PDMP not reviewed this encounter.   Payton Mccallum, MD 07/28/19 [phone]

## 2023-02-12 ENCOUNTER — Ambulatory Visit
Admission: EM | Admit: 2023-02-12 | Discharge: 2023-02-12 | Disposition: A | Discharge status: Home / Self Care | Payer: 59

## 2023-02-12 DIAGNOSIS — F17200 Nicotine dependence, unspecified, uncomplicated: Secondary | ICD-10-CM

## 2023-02-12 DIAGNOSIS — J069 Acute upper respiratory infection, unspecified: Secondary | ICD-10-CM

## 2023-02-12 NOTE — Discharge Instructions (Signed)
Most likely you have a viral illness: no antibiotic as indicated at this time, May treat with OTC meds of choice(flonase, tylenol, etc as label directed). Make sure to drink plenty of fluids to stay hydrated(gatorade, water, popsicles,jello,etc), avoid caffeine products. Follow up with PCP. Return as needed.

## 2023-02-12 NOTE — ED Provider Notes (Signed)
[facility]    CSN: [id number] Arrival date & time: 02/12/23  8295      History   Chief Complaint Chief Complaint  Patient presents with   Cough   Fever   Generalized Body Aches    HPI Jorge Bryan is a 47 y.o. male.   47 year old male pt, Jorge Bryan, presents to urgent care for relation of cough fever chills body aches headache x 4 days.  Patient states been taking Advil Cold and Sinus without relief.  Vital signs are normal and office today.  The history is provided by the patient. No language interpreter was used.    Past Medical History:  Diagnosis Date   GERD (gastroesophageal reflux disease)     Patient Active Problem List   Diagnosis Date Noted   Viral URI with cough 02/12/2023   Smoker 02/12/2023    Past Surgical History:  Procedure Laterality Date   ACHILLES TENDON REPAIR         Home Medications    Prior to Admission medications   Medication Sig Start Date End Date Taking? Authorizing Provider  aspirin 81 MG EC tablet Take 1 tablet (81 mg total) by mouth daily. Swallow whole. 03/04/16   Emily Filbert, MD  famotidine (PEPCID) 10 MG tablet Take 10 mg by mouth 2 (two) times daily.    [provider]  fluconazole (DIFLUCAN) 150 MG tablet Take one tablet po once, then repeat in 1 week 07/15/19   Payton Mccallum, MD  HYDROcodone-acetaminophen (NORCO) 5-325 MG per tablet Take 1-2 tablets by mouth every 4 (four) hours as needed for moderate pain. 11/04/14   Beers, Charmayne Sheer, PA-C  ibuprofen (ADVIL,MOTRIN) 800 MG tablet Take 1 tablet (800 mg total) by mouth every 8 (eight) hours as needed. 11/04/14   Beers, Charmayne Sheer, PA-C  Meloxicam (MOBIC PO) Take by mouth daily.    [provider]  Multiple Vitamin (MULTIVITAMIN) tablet Take 1 tablet by mouth daily.    [provider]  pantoprazole (PROTONIX) 40 MG tablet Take 1 tablet (40 mg total) by mouth daily. 03/04/16 03/04/17  Emily Filbert, MD    Family  History Family History  Problem Relation Age of Onset   Healthy Mother    Healthy Father     Social History Social History   Tobacco Use   Smoking status: Every Day    Types: Cigarettes   Smokeless tobacco: Never  Vaping Use   Vaping status: Never Used  Substance Use Topics   Alcohol use: No   Drug use: No     Allergies   Patient has no known allergies.   Review of Systems Review of Systems  Constitutional:  Positive for fever.  HENT:  Positive for congestion.   Respiratory:  Positive for cough.   Musculoskeletal:  Positive for myalgias.  Neurological:  Positive for headaches.  All other systems reviewed and are negative.    Physical Exam Triage Vital Signs ED Triage Vitals [02/12/23 0848]  Encounter Vitals Group     BP (!) 146/92     Systolic BP Percentile      Diastolic BP Percentile      Pulse Rate 90     Resp 19     Temp 98.7 F (37.1 C)     Temp Source Oral     SpO2 98 %     Weight      Height      Head Circumference  Peak Flow      Pain Score      Pain Loc      Pain Education      Exclude from Growth Chart    No data found.  Updated Vital Signs BP (!) 146/92 (BP Location: Right Arm)   Pulse 90   Temp 98.7 F (37.1 C) (Oral)   Resp 19   SpO2 98%   Visual Acuity Right Eye Distance:   Left Eye Distance:   Bilateral Distance:    Right Eye Near:   Left Eye Near:    Bilateral Near:     Physical Exam Vitals and nursing note reviewed.  Constitutional:      General: He is not in acute distress.    Appearance: He is well-developed and well-groomed.  HENT:     Head: Normocephalic and atraumatic.     Right Ear: Tympanic membrane is retracted.     Left Ear: Tympanic membrane is retracted.     Nose: Mucosal edema and congestion present.     Right Sinus: No maxillary sinus tenderness or frontal sinus tenderness.     Left Sinus: No maxillary sinus tenderness or frontal sinus tenderness.     Mouth/Throat:     Lips: Pink.     Mouth:  Mucous membranes are moist.     Pharynx: Oropharynx is clear. Uvula midline.  Eyes:     Conjunctiva/sclera: Conjunctivae normal.  Cardiovascular:     Rate and Rhythm: Normal rate and regular rhythm.     Pulses: Normal pulses.     Heart sounds: Normal heart sounds. No murmur heard. Pulmonary:     Effort: Pulmonary effort is normal. No respiratory distress.     Breath sounds: Normal breath sounds and air entry.     Comments: 98 % on RA Abdominal:     Palpations: Abdomen is soft.     Tenderness: There is no abdominal tenderness.  Musculoskeletal:        General: No swelling.     Cervical back: Neck supple.  Skin:    General: Skin is warm and dry.     Capillary Refill: Capillary refill takes less than 2 seconds.  Neurological:     General: No focal deficit present.     Mental Status: He is alert and oriented to person, place, and time.     GCS: GCS eye subscore is 4. GCS verbal subscore is 5. GCS motor subscore is 6.  Psychiatric:        Attention and Perception: Attention normal.        Mood and Affect: Mood normal.        Speech: Speech normal.        Behavior: Behavior normal. Behavior is cooperative.      UC Treatments / Results  Labs (all labs ordered are listed, but only abnormal results are displayed) Labs Reviewed - No data to display  EKG   Radiology No results found.  Procedures Procedures (including critical care time)  Medications Ordered in UC Medications - No data to display  Initial Impression / Assessment and Plan / UC Course  I have reviewed the triage vital signs and the nursing notes.  Pertinent labs & imaging results that were available during my care of the patient were reviewed by me and considered in my medical decision making (see chart for details).  Clinical Course as of 02/12/23 1458  Mon Feb 12, 2023  1093 Offered pt wait and see antibiotic course declined, offered cugh  medication,declined, offered work note declined. [JD]     Clinical Course User Index [JD] Romell Wolden, Para March, NP    Ddx; Viral illness, smoker, allergies Final Clinical Impressions(s) / UC Diagnoses   Final diagnoses:  Viral URI with cough  Smoker     Discharge Instructions      Most likely you have a viral illness: no antibiotic as indicated at this time, May treat with OTC meds of choice(flonase, tylenol, etc as label directed). Make sure to drink plenty of fluids to stay hydrated(gatorade, water, popsicles,jello,etc), avoid caffeine products. Follow up with PCP. Return as needed.     ED Prescriptions   None    PDMP not reviewed this encounter.   Clancy Gourd, NP 02/12/23 1458

## 2023-02-12 NOTE — ED Triage Notes (Signed)
cough, fever,chills ,bodyaches, ha 4 days been taking Advil cold and sinus with no relief.
